# Patient Record
Sex: Female | Born: 1960 | Race: White | Hispanic: No | Marital: Married
Health system: Southern US, Community
[De-identification: ages and names within clinical notes are randomized; demographics above are authoritative.]

---

## 2019-04-01 ENCOUNTER — Emergency Department (HOSPITAL_COMMUNITY)
Admission: EM | Admit: 2019-04-01 | Discharge: 2019-04-02 | Disposition: A | Discharge status: Home / Self Care | Payer: BC Managed Care – PPO | Attending: Emergency Medicine | Admitting: Emergency Medicine

## 2019-04-01 ENCOUNTER — Emergency Department (HOSPITAL_COMMUNITY): Payer: BC Managed Care – PPO

## 2019-04-01 ENCOUNTER — Other Ambulatory Visit: Payer: Self-pay

## 2019-04-01 DIAGNOSIS — Y92410 Unspecified street and highway as the place of occurrence of the external cause: Secondary | ICD-10-CM | POA: Insufficient documentation

## 2019-04-01 DIAGNOSIS — R519 Headache, unspecified: Secondary | ICD-10-CM | POA: Diagnosis not present

## 2019-04-01 DIAGNOSIS — S161XXA Strain of muscle, fascia and tendon at neck level, initial encounter: Secondary | ICD-10-CM

## 2019-04-01 DIAGNOSIS — Y999 Unspecified external cause status: Secondary | ICD-10-CM | POA: Insufficient documentation

## 2019-04-01 DIAGNOSIS — Y939 Activity, unspecified: Secondary | ICD-10-CM | POA: Diagnosis not present

## 2019-04-01 DIAGNOSIS — S199XXA Unspecified injury of neck, initial encounter: Secondary | ICD-10-CM | POA: Diagnosis present

## 2019-04-01 DIAGNOSIS — Z79899 Other long term (current) drug therapy: Secondary | ICD-10-CM | POA: Insufficient documentation

## 2019-04-01 DIAGNOSIS — S0083XA Contusion of other part of head, initial encounter: Secondary | ICD-10-CM | POA: Diagnosis not present

## 2019-04-01 LAB — CBC WITH DIFFERENTIAL/PLATELET
Abs Immature Granulocytes: 0.01 10*3/uL (ref 0.00–0.07)
Basophils Absolute: 0.1 10*3/uL (ref 0.0–0.1)
Basophils Relative: 1 %
Eosinophils Absolute: 0.1 10*3/uL (ref 0.0–0.5)
Eosinophils Relative: 1 %
HCT: 40.7 % (ref 36.0–46.0)
Hemoglobin: 13.3 g/dL (ref 12.0–15.0)
Immature Granulocytes: 0 %
Lymphocytes Relative: 18 %
Lymphs Abs: 1.2 10*3/uL (ref 0.7–4.0)
MCH: 30.3 pg (ref 26.0–34.0)
MCHC: 32.7 g/dL (ref 30.0–36.0)
MCV: 92.7 fL (ref 80.0–100.0)
Monocytes Absolute: 0.7 10*3/uL (ref 0.1–1.0)
Monocytes Relative: 10 %
Neutro Abs: 4.9 10*3/uL (ref 1.7–7.7)
Neutrophils Relative %: 70 %
Platelets: 261 10*3/uL (ref 150–400)
RBC: 4.39 MIL/uL (ref 3.87–5.11)
RDW: 11.9 % (ref 11.5–15.5)
WBC: 6.9 10*3/uL (ref 4.0–10.5)
nRBC: 0 % (ref 0.0–0.2)

## 2019-04-01 LAB — COMPREHENSIVE METABOLIC PANEL
ALT: 21 U/L (ref 0–44)
AST: 25 U/L (ref 15–41)
Albumin: 4.4 g/dL (ref 3.5–5.0)
Alkaline Phosphatase: 42 U/L (ref 38–126)
Anion gap: 16 — ABNORMAL HIGH (ref 5–15)
BUN: 20 mg/dL (ref 6–20)
CO2: 18 mmol/L — ABNORMAL LOW (ref 22–32)
Calcium: 10.4 mg/dL — ABNORMAL HIGH (ref 8.9–10.3)
Chloride: 107 mmol/L (ref 98–111)
Creatinine, Ser: 0.79 mg/dL (ref 0.44–1.00)
GFR calc Af Amer: >60 mL/min (ref >60)
GFR calc non Af Amer: >60 mL/min (ref >60)
Glucose, Bld: 134 mg/dL — ABNORMAL HIGH (ref 70–99)
Potassium: 3.6 mmol/L (ref 3.5–5.1)
Sodium: 141 mmol/L (ref 135–145)
Total Bilirubin: 0.7 mg/dL (ref 0.3–1.2)
Total Protein: 7 g/dL (ref 6.5–8.1)
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
## 2019-04-02 NOTE — ED Notes (Signed)
Pt returned from CT, head and neck done. Pt declined chest and abd/pelvis until she speaks with MD regarding reason for testing.
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
